# Patient Record
Sex: Male | Born: 1995 | Race: White | Hispanic: No | Marital: Single | Smoking: Never smoker
Health system: Southern US, Community
[De-identification: ages and names within clinical notes are randomized; demographics above are authoritative.]

## PROBLEM LIST (undated history)

## (undated) HISTORY — PX: WISDOM TOOTH EXTRACTION: SHX21

---

## 2020-03-03 DIAGNOSIS — S335XXA Sprain of ligaments of lumbar spine, initial encounter: Secondary | ICD-10-CM | POA: Diagnosis not present

## 2020-04-27 DIAGNOSIS — Z5181 Encounter for therapeutic drug level monitoring: Secondary | ICD-10-CM | POA: Diagnosis not present

## 2020-04-27 DIAGNOSIS — R69 Illness, unspecified: Secondary | ICD-10-CM | POA: Diagnosis not present

## 2020-04-27 DIAGNOSIS — F1220 Cannabis dependence, uncomplicated: Secondary | ICD-10-CM | POA: Diagnosis not present

## 2020-04-28 DIAGNOSIS — R69 Illness, unspecified: Secondary | ICD-10-CM | POA: Diagnosis not present

## 2020-04-28 DIAGNOSIS — F1220 Cannabis dependence, uncomplicated: Secondary | ICD-10-CM | POA: Diagnosis not present

## 2020-04-29 DIAGNOSIS — F1220 Cannabis dependence, uncomplicated: Secondary | ICD-10-CM | POA: Diagnosis not present

## 2020-04-29 DIAGNOSIS — R69 Illness, unspecified: Secondary | ICD-10-CM | POA: Diagnosis not present

## 2020-04-30 DIAGNOSIS — R69 Illness, unspecified: Secondary | ICD-10-CM | POA: Diagnosis not present

## 2020-04-30 DIAGNOSIS — F1220 Cannabis dependence, uncomplicated: Secondary | ICD-10-CM | POA: Diagnosis not present

## 2020-05-01 DIAGNOSIS — R69 Illness, unspecified: Secondary | ICD-10-CM | POA: Diagnosis not present

## 2020-05-01 DIAGNOSIS — F1220 Cannabis dependence, uncomplicated: Secondary | ICD-10-CM | POA: Diagnosis not present

## 2020-05-02 DIAGNOSIS — R69 Illness, unspecified: Secondary | ICD-10-CM | POA: Diagnosis not present

## 2020-05-02 DIAGNOSIS — F1220 Cannabis dependence, uncomplicated: Secondary | ICD-10-CM | POA: Diagnosis not present

## 2020-05-03 DIAGNOSIS — F1220 Cannabis dependence, uncomplicated: Secondary | ICD-10-CM | POA: Diagnosis not present

## 2020-05-03 DIAGNOSIS — R69 Illness, unspecified: Secondary | ICD-10-CM | POA: Diagnosis not present

## 2020-05-04 DIAGNOSIS — R69 Illness, unspecified: Secondary | ICD-10-CM | POA: Diagnosis not present

## 2020-05-04 DIAGNOSIS — F1220 Cannabis dependence, uncomplicated: Secondary | ICD-10-CM | POA: Diagnosis not present

## 2020-05-05 DIAGNOSIS — R69 Illness, unspecified: Secondary | ICD-10-CM | POA: Diagnosis not present

## 2020-05-05 DIAGNOSIS — F1220 Cannabis dependence, uncomplicated: Secondary | ICD-10-CM | POA: Diagnosis not present

## 2020-05-06 DIAGNOSIS — R69 Illness, unspecified: Secondary | ICD-10-CM | POA: Diagnosis not present

## 2020-05-06 DIAGNOSIS — F1220 Cannabis dependence, uncomplicated: Secondary | ICD-10-CM | POA: Diagnosis not present

## 2020-05-07 DIAGNOSIS — F1220 Cannabis dependence, uncomplicated: Secondary | ICD-10-CM | POA: Diagnosis not present

## 2020-05-07 DIAGNOSIS — R69 Illness, unspecified: Secondary | ICD-10-CM | POA: Diagnosis not present

## 2020-05-08 DIAGNOSIS — F1220 Cannabis dependence, uncomplicated: Secondary | ICD-10-CM | POA: Diagnosis not present

## 2020-05-08 DIAGNOSIS — R69 Illness, unspecified: Secondary | ICD-10-CM | POA: Diagnosis not present

## 2020-05-09 DIAGNOSIS — F1220 Cannabis dependence, uncomplicated: Secondary | ICD-10-CM | POA: Diagnosis not present

## 2020-05-09 DIAGNOSIS — R69 Illness, unspecified: Secondary | ICD-10-CM | POA: Diagnosis not present

## 2020-05-10 DIAGNOSIS — R69 Illness, unspecified: Secondary | ICD-10-CM | POA: Diagnosis not present

## 2020-05-10 DIAGNOSIS — F1220 Cannabis dependence, uncomplicated: Secondary | ICD-10-CM | POA: Diagnosis not present

## 2020-05-11 DIAGNOSIS — R69 Illness, unspecified: Secondary | ICD-10-CM | POA: Diagnosis not present

## 2020-05-11 DIAGNOSIS — F1220 Cannabis dependence, uncomplicated: Secondary | ICD-10-CM | POA: Diagnosis not present

## 2020-05-12 DIAGNOSIS — R69 Illness, unspecified: Secondary | ICD-10-CM | POA: Diagnosis not present

## 2020-05-12 DIAGNOSIS — F1220 Cannabis dependence, uncomplicated: Secondary | ICD-10-CM | POA: Diagnosis not present

## 2020-05-13 DIAGNOSIS — F1220 Cannabis dependence, uncomplicated: Secondary | ICD-10-CM | POA: Diagnosis not present

## 2020-05-13 DIAGNOSIS — R69 Illness, unspecified: Secondary | ICD-10-CM | POA: Diagnosis not present

## 2020-05-14 DIAGNOSIS — R69 Illness, unspecified: Secondary | ICD-10-CM | POA: Diagnosis not present

## 2020-05-14 DIAGNOSIS — F1220 Cannabis dependence, uncomplicated: Secondary | ICD-10-CM | POA: Diagnosis not present

## 2020-05-15 DIAGNOSIS — F1220 Cannabis dependence, uncomplicated: Secondary | ICD-10-CM | POA: Diagnosis not present

## 2020-05-15 DIAGNOSIS — R69 Illness, unspecified: Secondary | ICD-10-CM | POA: Diagnosis not present

## 2020-05-16 DIAGNOSIS — R69 Illness, unspecified: Secondary | ICD-10-CM | POA: Diagnosis not present

## 2020-05-16 DIAGNOSIS — F1220 Cannabis dependence, uncomplicated: Secondary | ICD-10-CM | POA: Diagnosis not present

## 2020-05-17 DIAGNOSIS — F1220 Cannabis dependence, uncomplicated: Secondary | ICD-10-CM | POA: Diagnosis not present

## 2020-05-17 DIAGNOSIS — R69 Illness, unspecified: Secondary | ICD-10-CM | POA: Diagnosis not present

## 2020-05-18 DIAGNOSIS — R69 Illness, unspecified: Secondary | ICD-10-CM | POA: Diagnosis not present

## 2020-05-18 DIAGNOSIS — F1220 Cannabis dependence, uncomplicated: Secondary | ICD-10-CM | POA: Diagnosis not present

## 2020-05-19 DIAGNOSIS — R69 Illness, unspecified: Secondary | ICD-10-CM | POA: Diagnosis not present

## 2020-05-19 DIAGNOSIS — F1220 Cannabis dependence, uncomplicated: Secondary | ICD-10-CM | POA: Diagnosis not present

## 2020-05-20 DIAGNOSIS — F1220 Cannabis dependence, uncomplicated: Secondary | ICD-10-CM | POA: Diagnosis not present

## 2020-05-20 DIAGNOSIS — R69 Illness, unspecified: Secondary | ICD-10-CM | POA: Diagnosis not present

## 2020-05-21 DIAGNOSIS — R69 Illness, unspecified: Secondary | ICD-10-CM | POA: Diagnosis not present

## 2020-05-21 DIAGNOSIS — F1220 Cannabis dependence, uncomplicated: Secondary | ICD-10-CM | POA: Diagnosis not present

## 2020-05-22 DIAGNOSIS — F1220 Cannabis dependence, uncomplicated: Secondary | ICD-10-CM | POA: Diagnosis not present

## 2020-05-22 DIAGNOSIS — R69 Illness, unspecified: Secondary | ICD-10-CM | POA: Diagnosis not present

## 2020-05-23 DIAGNOSIS — F1220 Cannabis dependence, uncomplicated: Secondary | ICD-10-CM | POA: Diagnosis not present

## 2020-05-23 DIAGNOSIS — R69 Illness, unspecified: Secondary | ICD-10-CM | POA: Diagnosis not present

## 2020-05-24 DIAGNOSIS — F1220 Cannabis dependence, uncomplicated: Secondary | ICD-10-CM | POA: Diagnosis not present

## 2020-05-24 DIAGNOSIS — R69 Illness, unspecified: Secondary | ICD-10-CM | POA: Diagnosis not present

## 2020-05-25 DIAGNOSIS — R69 Illness, unspecified: Secondary | ICD-10-CM | POA: Diagnosis not present

## 2020-05-25 DIAGNOSIS — F1220 Cannabis dependence, uncomplicated: Secondary | ICD-10-CM | POA: Diagnosis not present

## 2020-05-26 DIAGNOSIS — F1220 Cannabis dependence, uncomplicated: Secondary | ICD-10-CM | POA: Diagnosis not present

## 2020-05-26 DIAGNOSIS — R69 Illness, unspecified: Secondary | ICD-10-CM | POA: Diagnosis not present

## 2020-05-27 DIAGNOSIS — R69 Illness, unspecified: Secondary | ICD-10-CM | POA: Diagnosis not present

## 2020-05-27 DIAGNOSIS — F1220 Cannabis dependence, uncomplicated: Secondary | ICD-10-CM | POA: Diagnosis not present

## 2020-05-28 DIAGNOSIS — F1220 Cannabis dependence, uncomplicated: Secondary | ICD-10-CM | POA: Diagnosis not present

## 2020-05-28 DIAGNOSIS — R69 Illness, unspecified: Secondary | ICD-10-CM | POA: Diagnosis not present

## 2020-05-29 DIAGNOSIS — R69 Illness, unspecified: Secondary | ICD-10-CM | POA: Diagnosis not present

## 2020-05-29 DIAGNOSIS — F1220 Cannabis dependence, uncomplicated: Secondary | ICD-10-CM | POA: Diagnosis not present

## 2020-05-30 DIAGNOSIS — F1220 Cannabis dependence, uncomplicated: Secondary | ICD-10-CM | POA: Diagnosis not present

## 2020-05-30 DIAGNOSIS — R69 Illness, unspecified: Secondary | ICD-10-CM | POA: Diagnosis not present

## 2020-06-02 DIAGNOSIS — R69 Illness, unspecified: Secondary | ICD-10-CM | POA: Diagnosis not present

## 2020-06-02 DIAGNOSIS — Z5181 Encounter for therapeutic drug level monitoring: Secondary | ICD-10-CM | POA: Diagnosis not present

## 2020-06-02 DIAGNOSIS — F1220 Cannabis dependence, uncomplicated: Secondary | ICD-10-CM | POA: Diagnosis not present

## 2020-06-03 DIAGNOSIS — R69 Illness, unspecified: Secondary | ICD-10-CM | POA: Diagnosis not present

## 2020-06-03 DIAGNOSIS — F1220 Cannabis dependence, uncomplicated: Secondary | ICD-10-CM | POA: Diagnosis not present

## 2020-06-04 DIAGNOSIS — F1220 Cannabis dependence, uncomplicated: Secondary | ICD-10-CM | POA: Diagnosis not present

## 2020-06-04 DIAGNOSIS — R69 Illness, unspecified: Secondary | ICD-10-CM | POA: Diagnosis not present

## 2020-06-05 DIAGNOSIS — F1220 Cannabis dependence, uncomplicated: Secondary | ICD-10-CM | POA: Diagnosis not present

## 2020-06-05 DIAGNOSIS — R69 Illness, unspecified: Secondary | ICD-10-CM | POA: Diagnosis not present

## 2020-06-06 DIAGNOSIS — F1220 Cannabis dependence, uncomplicated: Secondary | ICD-10-CM | POA: Diagnosis not present

## 2020-06-06 DIAGNOSIS — R69 Illness, unspecified: Secondary | ICD-10-CM | POA: Diagnosis not present

## 2020-06-07 DIAGNOSIS — R69 Illness, unspecified: Secondary | ICD-10-CM | POA: Diagnosis not present

## 2020-06-07 DIAGNOSIS — F1220 Cannabis dependence, uncomplicated: Secondary | ICD-10-CM | POA: Diagnosis not present

## 2020-06-08 DIAGNOSIS — F1220 Cannabis dependence, uncomplicated: Secondary | ICD-10-CM | POA: Diagnosis not present

## 2020-06-08 DIAGNOSIS — R69 Illness, unspecified: Secondary | ICD-10-CM | POA: Diagnosis not present

## 2020-06-16 DIAGNOSIS — R69 Illness, unspecified: Secondary | ICD-10-CM | POA: Diagnosis not present

## 2020-06-22 DIAGNOSIS — R69 Illness, unspecified: Secondary | ICD-10-CM | POA: Diagnosis not present

## 2020-06-24 DIAGNOSIS — R69 Illness, unspecified: Secondary | ICD-10-CM | POA: Diagnosis not present

## 2020-06-29 DIAGNOSIS — R69 Illness, unspecified: Secondary | ICD-10-CM | POA: Diagnosis not present

## 2020-07-01 DIAGNOSIS — R69 Illness, unspecified: Secondary | ICD-10-CM | POA: Diagnosis not present

## 2020-07-03 DIAGNOSIS — R69 Illness, unspecified: Secondary | ICD-10-CM | POA: Diagnosis not present

## 2020-07-06 DIAGNOSIS — R69 Illness, unspecified: Secondary | ICD-10-CM | POA: Diagnosis not present

## 2020-07-10 DIAGNOSIS — R69 Illness, unspecified: Secondary | ICD-10-CM | POA: Diagnosis not present

## 2020-07-13 DIAGNOSIS — R69 Illness, unspecified: Secondary | ICD-10-CM | POA: Diagnosis not present

## 2020-07-15 DIAGNOSIS — R69 Illness, unspecified: Secondary | ICD-10-CM | POA: Diagnosis not present

## 2020-07-17 DIAGNOSIS — R69 Illness, unspecified: Secondary | ICD-10-CM | POA: Diagnosis not present

## 2020-07-22 DIAGNOSIS — R69 Illness, unspecified: Secondary | ICD-10-CM | POA: Diagnosis not present

## 2020-07-24 DIAGNOSIS — R69 Illness, unspecified: Secondary | ICD-10-CM | POA: Diagnosis not present

## 2020-07-27 DIAGNOSIS — R69 Illness, unspecified: Secondary | ICD-10-CM | POA: Diagnosis not present

## 2020-07-29 DIAGNOSIS — R69 Illness, unspecified: Secondary | ICD-10-CM | POA: Diagnosis not present

## 2020-07-31 DIAGNOSIS — R69 Illness, unspecified: Secondary | ICD-10-CM | POA: Diagnosis not present

## 2020-08-03 DIAGNOSIS — R69 Illness, unspecified: Secondary | ICD-10-CM | POA: Diagnosis not present

## 2020-08-03 DIAGNOSIS — J302 Other seasonal allergic rhinitis: Secondary | ICD-10-CM | POA: Diagnosis not present

## 2020-08-05 DIAGNOSIS — R69 Illness, unspecified: Secondary | ICD-10-CM | POA: Diagnosis not present

## 2020-08-07 DIAGNOSIS — R69 Illness, unspecified: Secondary | ICD-10-CM | POA: Diagnosis not present

## 2020-08-10 DIAGNOSIS — R69 Illness, unspecified: Secondary | ICD-10-CM | POA: Diagnosis not present

## 2020-08-12 DIAGNOSIS — R69 Illness, unspecified: Secondary | ICD-10-CM | POA: Diagnosis not present

## 2020-08-17 DIAGNOSIS — R69 Illness, unspecified: Secondary | ICD-10-CM | POA: Diagnosis not present

## 2020-08-19 DIAGNOSIS — R69 Illness, unspecified: Secondary | ICD-10-CM | POA: Diagnosis not present

## 2020-08-21 DIAGNOSIS — R69 Illness, unspecified: Secondary | ICD-10-CM | POA: Diagnosis not present

## 2020-08-24 DIAGNOSIS — R69 Illness, unspecified: Secondary | ICD-10-CM | POA: Diagnosis not present

## 2020-08-26 DIAGNOSIS — R69 Illness, unspecified: Secondary | ICD-10-CM | POA: Diagnosis not present

## 2020-08-28 DIAGNOSIS — R69 Illness, unspecified: Secondary | ICD-10-CM | POA: Diagnosis not present

## 2020-09-02 DIAGNOSIS — R69 Illness, unspecified: Secondary | ICD-10-CM | POA: Diagnosis not present

## 2020-09-07 DIAGNOSIS — R69 Illness, unspecified: Secondary | ICD-10-CM | POA: Diagnosis not present

## 2020-09-11 DIAGNOSIS — R69 Illness, unspecified: Secondary | ICD-10-CM | POA: Diagnosis not present

## 2020-09-14 DIAGNOSIS — R69 Illness, unspecified: Secondary | ICD-10-CM | POA: Diagnosis not present

## 2020-12-21 DIAGNOSIS — H5213 Myopia, bilateral: Secondary | ICD-10-CM | POA: Diagnosis not present

## 2021-03-10 DIAGNOSIS — R69 Illness, unspecified: Secondary | ICD-10-CM | POA: Diagnosis not present

## 2021-03-10 DIAGNOSIS — Z79899 Other long term (current) drug therapy: Secondary | ICD-10-CM | POA: Diagnosis not present

## 2021-03-10 DIAGNOSIS — Z23 Encounter for immunization: Secondary | ICD-10-CM | POA: Diagnosis not present

## 2021-03-10 DIAGNOSIS — J302 Other seasonal allergic rhinitis: Secondary | ICD-10-CM | POA: Diagnosis not present

## 2021-03-10 DIAGNOSIS — Z0000 Encounter for general adult medical examination without abnormal findings: Secondary | ICD-10-CM | POA: Diagnosis not present

## 2021-03-10 DIAGNOSIS — Z7951 Long term (current) use of inhaled steroids: Secondary | ICD-10-CM | POA: Diagnosis not present

## 2021-04-10 ENCOUNTER — Encounter: Payer: Self-pay | Admitting: Emergency Medicine

## 2021-04-10 ENCOUNTER — Emergency Department (INDEPENDENT_AMBULATORY_CARE_PROVIDER_SITE_OTHER): Payer: 59

## 2021-04-10 ENCOUNTER — Other Ambulatory Visit: Payer: Self-pay

## 2021-04-10 ENCOUNTER — Emergency Department (INDEPENDENT_AMBULATORY_CARE_PROVIDER_SITE_OTHER)
Admission: EM | Admit: 2021-04-10 | Discharge: 2021-04-10 | Disposition: A | Discharge status: Home / Self Care | Payer: 59 | Source: Home / Self Care

## 2021-04-10 DIAGNOSIS — S40012A Contusion of left shoulder, initial encounter: Secondary | ICD-10-CM

## 2021-04-10 DIAGNOSIS — M25512 Pain in left shoulder: Secondary | ICD-10-CM | POA: Diagnosis not present

## 2021-04-10 MED ORDER — PREDNISONE 20 MG PO TABS: ORAL_TABLET | ORAL | 0 refills | Status: AC

## 2021-04-10 MED ORDER — BACLOFEN 10 MG PO TABS: 10.0000 mg | ORAL_TABLET | Freq: Three times a day (TID) | ORAL | 0 refills | Status: AC

## 2021-04-10 NOTE — Discharge Instructions (Addendum)
Advised patient to take medication as directed with food to completion.  Advised patient may take baclofen daily, as needed.  Encourage patient increase daily water intake while taking this medication.  Advised patient if symptoms worsen and or unresolved please follow-up with orthopedic contacts above/call for appointment for further evaluation.
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
## 2021-05-24 ENCOUNTER — Ambulatory Visit (INDEPENDENT_AMBULATORY_CARE_PROVIDER_SITE_OTHER): Payer: 59 | Admitting: Licensed Clinical Social Worker

## 2021-05-24 ENCOUNTER — Other Ambulatory Visit: Payer: Self-pay

## 2021-05-24 DIAGNOSIS — F1011 Alcohol abuse, in remission: Secondary | ICD-10-CM
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
## 2021-05-31 ENCOUNTER — Other Ambulatory Visit: Payer: Self-pay

## 2021-05-31 ENCOUNTER — Ambulatory Visit (INDEPENDENT_AMBULATORY_CARE_PROVIDER_SITE_OTHER): Payer: 59 | Admitting: Licensed Clinical Social Worker

## 2021-05-31 DIAGNOSIS — F1011 Alcohol abuse, in remission: Secondary | ICD-10-CM

## 2021-05-31 NOTE — Progress Notes (Signed)
Virtual Visit via Video Note  I connected with Jack Mitchell on 05/31/21 at  1:00 PM EDT by a video enabled telemedicine application and verified that I am speaking with the correct person using two identifiers.  Location: Patient: home Provider: office   I discussed the limitations of evaluation and management by telemedicine and the availability of in person appointments. The patient expressed understanding and agreed to proceed.  I discussed the assessment and treatment plan with the patient. The patient was provided an opportunity to ask questions and all were answered. The patient agreed with the plan and demonstrated an understanding of the instructions.   The patient was advised to call back or seek an in-person evaluation if the symptoms worsen or if the condition fails to improve as anticipated.  I provided 30 minutes of non-face-to-face time during this encounter.   Harlon Ditty, LCSW    THERAPIST PROGRESS NOTE  Session Time: 1pm-135pm  Participation Level: Active  Behavioral Response: CasualAlertEuthymic  Type of Therapy: Individual Therapy  Treatment Goals addressed: Coping and Diagnosis: maintained sobriety 7/7 days weekly  Interventions: CBT, Motivational Interviewing, and Supportive  Summary: Jack Mitchell is a 25 y.o. male who presents with alcohol use disorder in recovery. Client showed progress toward goal AEB maintained sobriety date of 04/28/20 and attended 3 AA meetings over the past week to maintain sober support system. Client was able to identify potential triggers for relapse prevention plan including feeling left out/boredom/loneliness, holidays which typically involve heavy social drinking, grief/loss, and career roadblocks. Client was able to identify protective factors such as limiting engagement in high risk situations and focusing on goals rather than uncomfortable thoughts.   Suicidal/Homicidal: Nowithout intent/plan  Therapist Response:  Clinician checked in with client, assessed for SI/HI/psychosis and overall level of functioning including levels of depression/anxiety, which client rated 3/10 and any problematic cravings which client denied. Clinician presented topic of relapse prevention planning and praised client on identifying possible triggers in the future as well as current plans to address.  Plan: Return again in 2 weeks.  Diagnosis: Axis I: Alcohol Abuse   Harlon Ditty, LCSW 05/31/2021

## 2021-06-14 ENCOUNTER — Other Ambulatory Visit: Payer: Self-pay

## 2021-06-14 ENCOUNTER — Ambulatory Visit (INDEPENDENT_AMBULATORY_CARE_PROVIDER_SITE_OTHER): Payer: 59 | Admitting: Licensed Clinical Social Worker

## 2021-06-14 DIAGNOSIS — F1011 Alcohol abuse, in remission: Secondary | ICD-10-CM

## 2021-06-14 NOTE — Progress Notes (Signed)
  Virtual Visit via Video Note  I connected with Jack Mitchell Friday on 06/14/2021 at 10:00 AM EDT by a video enabled telemedicine application and verified that I am speaking with the correct person using two identifiers.  Location: Patient: home Provider: office   I discussed the limitations of evaluation and management by telemedicine and the availability of in person appointments. The patient expressed understanding and agreed to proceed.   I discussed the assessment and treatment plan with the patient. The patient was provided an opportunity to ask questions and all were answered. The patient agreed with the plan and demonstrated an understanding of the instructions.   The patient was advised to call back or seek an in-person evaluation if the symptoms worsen or if the condition fails to improve as anticipated.  I provided 45 minutes of non-face-to-face time during this encounter.   Olegario Messier, LCSW   THERAPIST PROGRESS NOTE  Session Time: 10am-1045am  Participation Level: Active  Behavioral Response: CasualAlertEuthymic  Type of Therapy: Individual Therapy  Treatment Goals addressed: Coping and Diagnosis: achieve and maintain sobriety 7/7 days weekly  Interventions: CBT, Motivational Interviewing, and Supportive, relapse prevention  Summary: Jack Mitchell is a 25 y.o. male who presents with alcohol use disorder in remission. Client showed progress toward goal AEB maintained sobriety 7/7 days weekly and attendance of virtual [facility] meetings. Client identified boredom, frustration related to not working/finances, and a recent argument with girlfriend as stressors. Client was able to problem solve discussion with girlfriend and utilize distraction skills when bored to avoid substance use. Client was able to demonstrate refusal skills recently used to avoid alcohol in social situations.   Suicidal/Homicidal: Nowithout intent/plan  Therapist Response: Clinician met with client,  assessed for SI/HI/psychosis and overall level of functioning including any substance use, which client denied. Clinician praised client use of assertive communication skills and distress tolerance skills for emotional regulation and problem solving. Clinician and client utilized solution focused skills to create plan for obtaining return to work requirements. Clinician reviewed pretzel and butterfly tapping as skills for managing anxiety as well as part of the TIPP skill and activation of the parasympathetic nervous system. Clinician and client reviewed relapse prevention plan specific to upcoming holiday and social events.  Plan: Return again in 2 weeks.  Diagnosis: Axis I: Alcohol Abuse      Olegario Messier, LCSW 06/14/2021

## 2021-06-28 ENCOUNTER — Ambulatory Visit (INDEPENDENT_AMBULATORY_CARE_PROVIDER_SITE_OTHER): Payer: 59 | Admitting: Licensed Clinical Social Worker

## 2021-06-28 ENCOUNTER — Other Ambulatory Visit: Payer: Self-pay

## 2021-06-28 DIAGNOSIS — F1011 Alcohol abuse, in remission: Secondary | ICD-10-CM

## 2021-06-28 NOTE — Progress Notes (Signed)
Virtual Visit via Video Note  I connected with Jack Mitchell on 06/28/21 at  9:00 AM EDT by a video enabled telemedicine application and verified that I am speaking with the correct person using two identifiers.  Location: Patient: home Provider: office   I discussed the limitations of evaluation and management by telemedicine and the availability of in person appointments. The patient expressed understanding and agreed to proceed.   I discussed the assessment and treatment plan with the patient. The patient was provided an opportunity to ask questions and all were answered. The patient agreed with the plan and demonstrated an understanding of the instructions.   The patient was advised to call back or seek an in-person evaluation if the symptoms worsen or if the condition fails to improve as anticipated.  I provided 30 minutes of non-face-to-face time during this encounter.   Harlon Ditty, LCSW    THERAPIST PROGRESS NOTE  Session Time: 9:05am-935am  Participation Level: Active  Behavioral Response: CasualAlertEuthymic  Type of Therapy: Individual Therapy  Treatment Goals addressed: Coping and Diagnosis: maintain sobriety from alcohol 7/7 day weekly  Interventions: CBT and Supportive  Summary: Jack Mitchell is a 25 y.o. male who presents with alcohol use disorder, mild, in sustained remission. Client processed recent successes and challenges related to recovery. Client identified examples of appropriate responses to elevated emotions and ability to de-escalate uncomfortable feelings. Client provided examples of activities put in place to address boredom, a previously identified trigger for alcohol use. Client processed changes in habits/cravings this football season compared to last season immediately following residential treatment. Client noted no major triggers or rituals.  Suicidal/Homicidal: Nowithout intent/plan  Therapist Response: Clinician checked in with client,  assessed for SI/HI/psychosis and overall level of functioning including sobriety and attendance at community support meetings. Clinician inquired about plans for continued avoidance of rituals related to drinking for upcoming holidays and reviewed relapse prevention plan with client.  Plan: Return again in 2-3 weeks.  Diagnosis: Axis I: Alcohol Abuse     Harlon Ditty, LCSW 06/28/2021

## 2021-07-12 ENCOUNTER — Telehealth (HOSPITAL_COMMUNITY): Payer: Self-pay | Admitting: Licensed Clinical Social Worker

## 2021-07-12 ENCOUNTER — Other Ambulatory Visit: Payer: Self-pay

## 2021-07-12 ENCOUNTER — Ambulatory Visit (HOSPITAL_COMMUNITY): Payer: Self-pay | Admitting: Licensed Clinical Social Worker

## 2021-07-19 ENCOUNTER — Other Ambulatory Visit: Payer: Self-pay

## 2021-07-19 ENCOUNTER — Ambulatory Visit (INDEPENDENT_AMBULATORY_CARE_PROVIDER_SITE_OTHER): Payer: 59 | Admitting: Licensed Clinical Social Worker

## 2021-07-19 DIAGNOSIS — F1011 Alcohol abuse, in remission: Secondary | ICD-10-CM | POA: Diagnosis not present

## 2021-07-19 NOTE — Progress Notes (Signed)
Virtual Visit via Video Note  I connected with Jack Mitchell on 07/19/21 at  9:00 AM EDT by a video enabled telemedicine application and verified that I am speaking with the correct person using two identifiers.  Location: Patient: home Provider: office   I discussed the limitations of evaluation and management by telemedicine and the availability of in person appointments. The patient expressed understanding and agreed to proceed.   I discussed the assessment and treatment plan with the patient. The patient was provided an opportunity to ask questions and all were answered. The patient agreed with the plan and demonstrated an understanding of the instructions.   The patient was advised to call back or seek an in-person evaluation if the symptoms worsen or if the condition fails to improve as anticipated.  I provided 30 minutes of non-face-to-face time during this encounter.   Harlon Ditty, LCSW    THERAPIST PROGRESS NOTE  Session Time: [id number]  Participation Level: Active  Behavioral Response: NeatAlertEuthymic  Type of Therapy: Individual Therapy  Treatment Goals addressed: Coping and Diagnosis: maintain sobriety from all mood altering substances 7/7 days weekly  Interventions: CBT, Motivational Interviewing, and Supportive  Summary: Jack Mitchell is a 25 y.o. male who presents with alcohol use disorder in sustained remission. Client identified not being able to return to work and mild trigger for frustration but showed progress toward goals AEB use of distraction skills to avoid boredom. Client was receptive to radical acceptance skills and working creatively with what is in the present moment.   Suicidal/Homicidal: No  Therapist Response: Clinician checked in with client who presented as fully oriented, sober, and not psychotic at the time of appointment. Clinician inquired about any recent triggers and coping skills used to avoid drinking. Clinician presented radical  acceptance. Clinician and client reviewed relapse prevention plan including clients attendance at 4 community support groups since last session.  Plan: Return again in 2-3 weeks.  Diagnosis: Axis I: Alcohol Abuse      Harlon Ditty, LCSW 07/19/2021

## 2021-08-02 ENCOUNTER — Other Ambulatory Visit: Payer: Self-pay

## 2021-08-02 ENCOUNTER — Ambulatory Visit (INDEPENDENT_AMBULATORY_CARE_PROVIDER_SITE_OTHER): Payer: 59 | Admitting: Licensed Clinical Social Worker

## 2021-08-02 DIAGNOSIS — F1010 Alcohol abuse, uncomplicated: Secondary | ICD-10-CM | POA: Diagnosis not present

## 2021-08-02 NOTE — Progress Notes (Signed)
Virtual Visit via Video Note  I connected with Carolan Shiver on 08/02/21 at 10:00 AM EDT by a video enabled telemedicine application and verified that I am speaking with the correct person using two identifiers.  Location: Patient: home Provider: office   I discussed the limitations of evaluation and management by telemedicine and the availability of in person appointments. The patient expressed understanding and agreed to proceed.  I discussed the assessment and treatment plan with the patient. The patient was provided an opportunity to ask questions and all were answered. The patient agreed with the plan and demonstrated an understanding of the instructions.   The patient was advised to call back or seek an in-person evaluation if the symptoms worsen or if the condition fails to improve as anticipated.  I provided 45 minutes of non-face-to-face time during this encounter.   Harlon Ditty, LCSW    THERAPIST PROGRESS NOTE  Session Time: 07-1044  Participation Level: Active  Behavioral Response: CasualAlertAnxious  Type of Therapy: Individual Therapy  Treatment Goals addressed: Anxiety, Coping, and Diagnosis: substance use d/o  Interventions: Motivational Interviewing and Supportive  Summary: Keylin Podolsky is a 25 y.o. male who presents with alcohol use disorder. Client processed increased anxiety following recent cannabis use. Client processed thoughts and feelings related to lapse and was able to identify consequences to behavior. Client verbalized radical acceptance of board response being out of his control and discussed options to occupy time until can return to work.  Suicidal/Homicidal: No  Therapist Response: Clinician discussed with client lapse vs relapse. Clinician validated client thoughts/feelings around feeling stuck, missing out, or not fitting in with peers based on current circumstances. 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 Clinician and client discussed option of SMART recovery in place of AA and problem solved ways to avoid isolation.  Plan: Return again in 1-2 weeks.  Diagnosis: Axis I: Alcohol Abuse        Harlon Ditty, LCSW 08/02/2021

## 2021-08-09 ENCOUNTER — Ambulatory Visit (HOSPITAL_COMMUNITY): Payer: 59 | Admitting: Licensed Clinical Social Worker

## 2021-08-10 ENCOUNTER — Other Ambulatory Visit: Payer: Self-pay

## 2021-08-10 ENCOUNTER — Ambulatory Visit (INDEPENDENT_AMBULATORY_CARE_PROVIDER_SITE_OTHER): Payer: 59 | Admitting: Licensed Clinical Social Worker

## 2021-08-10 DIAGNOSIS — F1010 Alcohol abuse, uncomplicated: Secondary | ICD-10-CM

## 2021-08-10 NOTE — Progress Notes (Signed)
Virtual Visit via Video Note  I connected with Jack Mitchell on 08/10/21 at 10:00 AM EDT by a video enabled telemedicine application and verified that I am speaking with the correct person using two identifiers.  Location: Patient: home  Provider: office   I discussed the limitations of evaluation and management by telemedicine and the availability of in person appointments. The patient expressed understanding and agreed to proceed.    I discussed the assessment and treatment plan with the patient. The patient was provided an opportunity to ask questions and all were answered. The patient agreed with the plan and demonstrated an understanding of the instructions.   The patient was advised to call back or seek an in-person evaluation if the symptoms worsen or if the condition fails to improve as anticipated.  I provided 45 minutes of non-face-to-face time during this encounter.   Harlon Ditty, LCSW    THERAPIST PROGRESS NOTE  Session Time: 1005-1050  Participation Level: Active  Behavioral Response: CasualAlertDysphoric  Type of Therapy: Individual Therapy  Treatment Goals addressed: Coping and Diagnosis: alcohol use d/o  Interventions: CBT, Motivational Interviewing, and Supportive  Summary: Jack Mitchell is a 25 y.o. male who presents with alcohol use d/o in remission. Client processed frustration with forced tx he feels un-necessary. 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  Suicidal/Homicidal: No  Therapist Response: Clinician checked in with client, assessed for SI/HI/psychosis and overall level of functioning. Clinician validated feelings around ongoing, changing situation. Clinician processed consequence of slip. Client reviewed asam and dx criteria with client as well as options for different levels of care.  Plan: Return again in 2-3 weeks.  Diagnosis: Axis I: Alcohol Abuse and  Substance Induced Mood Disorder     Harlon Ditty, LCSW 08/10/2021

## 2021-08-23 ENCOUNTER — Other Ambulatory Visit: Payer: Self-pay

## 2021-08-23 ENCOUNTER — Ambulatory Visit (INDEPENDENT_AMBULATORY_CARE_PROVIDER_SITE_OTHER): Payer: 59 | Admitting: Licensed Clinical Social Worker

## 2021-08-23 DIAGNOSIS — F1010 Alcohol abuse, uncomplicated: Secondary | ICD-10-CM | POA: Diagnosis not present

## 2021-08-23 NOTE — Progress Notes (Signed)
Virtual Visit via Video Note  I connected with Jack Mitchell on 08/23/21 at 10:00 AM EST by a video enabled telemedicine application and verified that I am speaking with the correct person using two identifiers.  Location: Patient: home Provider: office   I discussed the limitations of evaluation and management by telemedicine and the availability of in person appointments. The patient expressed understanding and agreed to proceed.  I discussed the assessment and treatment plan with the patient. The patient was provided an opportunity to ask questions and all were answered. The patient agreed with the plan and demonstrated an understanding of the instructions.   The patient was advised to call back or seek an in-person evaluation if the symptoms worsen or if the condition fails to improve as anticipated.  I provided 30 minutes of non-face-to-face time during this encounter.   Harlon Ditty, LCSW    THERAPIST PROGRESS NOTE  Session Time: 07-1029  Participation Level: Active  Behavioral Response: CasualAlertDysphoric  Type of Therapy: Individual Therapy  Treatment Goals addressed: Coping  Interventions: CBT and Supportive  Summary: Jack Mitchell is a 25 y.o. male who presents with alcohol use disorder in remission, recent cannabis use.   Suicidal/Homicidal: No  Therapist Response: Clinician checked in with client, assessed for SI/HI/psychosis and overall level of functioning. Clinician processed with client options for continued treatment. Clinician and client processed the effect of boredom on MH or cravings. Client denied cravings and provided options for temporary employment. Clinician and client discussed plans for the holidays if needed to receive support for sobriety. Client provided personal supports and considered engagements. Due to limited transportation client has attended virtual AA meetings.  Plan: Return again in 3 weeks.  Diagnosis: Axis I: Alcohol  Abuse        Harlon Ditty, LCSW 08/23/2021

## 2021-09-06 ENCOUNTER — Ambulatory Visit (HOSPITAL_COMMUNITY): Payer: 59 | Admitting: Licensed Clinical Social Worker

## 2021-09-14 ENCOUNTER — Other Ambulatory Visit: Payer: Self-pay

## 2021-09-14 ENCOUNTER — Ambulatory Visit (INDEPENDENT_AMBULATORY_CARE_PROVIDER_SITE_OTHER): Payer: 59 | Admitting: Licensed Clinical Social Worker

## 2021-09-14 DIAGNOSIS — F1010 Alcohol abuse, uncomplicated: Secondary | ICD-10-CM | POA: Diagnosis not present

## 2021-09-14 NOTE — Progress Notes (Signed)
Virtual Visit via Video Note  I connected with Jack Mitchell on 09/14/21 at 10:00 AM EST by a video enabled telemedicine application and verified that I am speaking with the correct person using two identifiers.  Location: Patient: home Provider: office   I discussed the limitations of evaluation and management by telemedicine and the availability of in person appointments. The patient expressed understanding and agreed to proceed.  I discussed the assessment and treatment plan with the patient. The patient was provided an opportunity to ask questions and all were answered. The patient agreed with the plan and demonstrated an understanding of the instructions.   The patient was advised to call back or seek an in-person evaluation if the symptoms worsen or if the condition fails to improve as anticipated.  I provided 45 minutes of non-face-to-face time during this encounter.   Harlon Ditty, LCSW    THERAPIST PROGRESS NOTE  Session Time: 07-1044  Participation Level: Active  Behavioral Response: CasualAlertEuthymic  Type of Therapy: Individual Therapy  Treatment Goals addressed: Coping  Interventions: CBT and Supportive  Summary: Jack Mitchell is a 25 y.o. male who presents with alcohol use disorder. Client reported continued sobriety from alcohol. Client discussed plan to obtain employment which he identified would relieve some stressors.  Suicidal/Homicidal: No  Therapist Response: Clinician checked in, assessed for SI/HI/psychosis and overall level of functioning. Clinician and client processed recent loss and connected thoughts/feelings/behaviors. Clinician and client processed previous grief responses to 'planned' loss related to natural causes vs history of friends' OD.  Plan: Return again in 2-3 weeks.  Diagnosis: Axis I: Alcohol Abuse        Harlon Ditty, LCSW 09/14/2021

## 2021-09-27 ENCOUNTER — Ambulatory Visit (INDEPENDENT_AMBULATORY_CARE_PROVIDER_SITE_OTHER): Payer: 59 | Admitting: Licensed Clinical Social Worker

## 2021-09-27 ENCOUNTER — Other Ambulatory Visit: Payer: Self-pay

## 2021-09-27 DIAGNOSIS — F1011 Alcohol abuse, in remission: Secondary | ICD-10-CM

## 2021-09-27 NOTE — Progress Notes (Signed)
Virtual Visit via Video Note  I connected with Jack Mitchell on 09/27/21 at  9:00 AM EST by a video enabled telemedicine application and verified that I am speaking with the correct person using two identifiers.  Location: Patient: home Provider: office   I discussed the limitations of evaluation and management by telemedicine and the availability of in person appointments. The patient expressed understanding and agreed to proceed.  I discussed the assessment and treatment plan with the patient. The patient was provided an opportunity to ask questions and all were answered. The patient agreed with the plan and demonstrated an understanding of the instructions.   The patient was advised to call back or seek an in-person evaluation if the symptoms worsen or if the condition fails to improve as anticipated.  I provided 35 minutes of non-face-to-face time during this encounter.   Harlon Ditty, LCSW    THERAPIST PROGRESS NOTE  Session Time: 9am-935am  Participation Level: Active  Behavioral Response: CasualAlertEuthymic  Type of Therapy: Individual Therapy  Treatment Goals addressed: Coping and Diagnosis: Achieve and maintain sobriety 7/7 days weekly from all mind/mood altering substances.  Interventions: CBT, Motivational Interviewing, and Other: relapse prevention  Summary: Jack Mitchell is a 25 y.o. male who presents with alcohol abuse in remission with cannabis use within the past 6 months. Client showed progress toward goal AEB maintained sobriety since last session. Client required continued support building a sober support system AEB decrease in AA meeting attendance. Client had a goal of looking into SMART recovery meetings online. Client was able to update crisis/relapse prevention plan to include triggers, distraction skills, supportive people. Client was receptive to crisis resources. Client demonstrated ability to challenged catastrophe thoughts and maintain healthy  thought patterns throughout the week and voiced plans for the upcoming holidays to support sobriety. Client identified a decreased intensity to engage in substance use compared to previous years in early recovery.  Suicidal/Homicidal: No  Therapist Response: Clinician checked in, assessed for SI/HI/psychosis and overall level of functioning including substance use or engagement with community support group. Clinician inquired about any challenges over the holidays. Clinician reviewed relapse prevention plan, challenging client to identify different possible triggers. Clinician provided client with crisis resources in the community.  Plan: Return again in 4 weeks.  Diagnosis: Axis I: Alcohol Abuse       Harlon Ditty, LCSW 09/27/2021

## 2021-10-18 ENCOUNTER — Ambulatory Visit (INDEPENDENT_AMBULATORY_CARE_PROVIDER_SITE_OTHER): Payer: 59 | Admitting: Licensed Clinical Social Worker

## 2021-10-18 ENCOUNTER — Other Ambulatory Visit: Payer: Self-pay

## 2021-10-18 DIAGNOSIS — F1011 Alcohol abuse, in remission: Secondary | ICD-10-CM

## 2021-10-18 NOTE — Progress Notes (Signed)
Virtual Visit via Video Note  I connected with Jack Mitchell on 10/18/21 at 10:00 AM EST by a video enabled telemedicine application and verified that I am speaking with the correct person using two identifiers.  Location: Patient: home Provider: office   I discussed the limitations of evaluation and management by telemedicine and the availability of in person appointments. The patient expressed understanding and agreed to proceed.  I discussed the assessment and treatment plan with the patient. The patient was provided an opportunity to ask questions and all were answered. The patient agreed with the plan and demonstrated an understanding of the instructions.   The patient was advised to call back or seek an in-person evaluation if the symptoms worsen or if the condition fails to improve as anticipated.  I provided 30 minutes of non-face-to-face time during this encounter.   Olegario Messier, LCSW    THERAPIST PROGRESS NOTE  Session Time: 07-1029  Participation Level: Active  Behavioral Response: Casual and NeatAlertEuthymic  Type of Therapy: Individual Therapy  Treatment Goals addressed: Coping and Diagnosis: Client will maintain recovery from alcohol 7/7 days weekly.  Interventions: CBT and Supportive  Summary: Jack Mitchell is a 26 y.o. male who presents with alcohol use disorder in remission. Client showed progress toward goal AEB maintained sobriety since previous session. 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 Client reported girlfriend helped with negative self talk around holidays and lack of progress with licensure board. Client continues to maintain sobriety, complete UDS, and attend AA meetings. Client processed frustration with licensing board and clinical recommendations not aligning.   Suicidal/Homicidal: No  Therapist Response: Clinician checked in with  client, assessed for SI/HI/psychosis and any cravings or substance use. Clinician and client processed with client taking lower paying job not related to field of study. Clinician validated feelings around uncertainty with expectations from board and praised use of radical acceptance for situations to avoid ruminating thoughts.  Plan: Return again in 3-4 weeks.  Diagnosis: Axis I: Substance Abuse       Olegario Messier, LCSW 10/18/2021

## 2021-11-08 ENCOUNTER — Other Ambulatory Visit: Payer: Self-pay

## 2021-11-08 ENCOUNTER — Ambulatory Visit (INDEPENDENT_AMBULATORY_CARE_PROVIDER_SITE_OTHER): Payer: 59 | Admitting: Licensed Clinical Social Worker

## 2021-11-08 DIAGNOSIS — F1011 Alcohol abuse, in remission: Secondary | ICD-10-CM | POA: Diagnosis not present

## 2021-11-08 NOTE — Progress Notes (Signed)
Virtual Visit via Video Note  I connected with Jack Mitchell on 11/08/21 at 10:00 AM EST by a video enabled telemedicine application and verified that I am speaking with the correct person using two identifiers.  Location: Patient: home Provider: office   I discussed the limitations of evaluation and management by telemedicine and the availability of in person appointments. The patient expressed understanding and agreed to proceed.  I discussed the assessment and treatment plan with the patient. The patient was provided an opportunity to ask questions and all were answered. The patient agreed with the plan and demonstrated an understanding of the instructions.   The patient was advised to call back or seek an in-person evaluation if the symptoms worsen or if the condition fails to improve as anticipated.  I provided 30 minutes of non-face-to-face time during this encounter.   Olegario Messier, LCSW    THERAPIST PROGRESS NOTE  Session Time: 07-1031  Participation Level: Active  Behavioral Response: Casual and NeatAlertEuthymic  Type of Therapy: Individual Therapy  Treatment Goals addressed: Coping and Diagnosis: Client with achieve and maintain sobriety 7/7 days weekly.  Interventions: CBT and Supportive  Summary: Jack Mitchell is a 26 y.o. male who presents with alcohol use disorder in remission. Client reported improved mood with starting new job and feeling productive. Client reported no concerns with cravings and endorsed no alcohol use since previous session. Client processed link of concern with anxiety around the unknown at work at new job and previous job when unsure how shift before him completed work. Client was able to identify behaviors in his control as well as reframed thoughts to decrease anxiety around current work situation. Client is in agreement with termination process though states he has not contacted a new provider on the list given to him. Client will  contact office with additional questions or concerns as needed.   Suicidal/Homicidal: No  Therapist Response:Clinician checked in with client, assessed for SI/HI/psychosis and overall level of functioning including substance use. Clinician praised client use of reframing distorted thinking. Clinician provided additional education on the benefits of completing mindfulness activities on anxiety and encouraged client to re-engage in activities previously identified as supportive.  Plan: Link with new provider from list given to client previously.  Diagnosis: Axis I: Alcohol Abuse       Olegario Messier, LCSW 11/08/2021
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
